# Patient Record
Sex: Female | Born: 2005 | Race: White | Hispanic: No | Marital: Single | Smoking: Never smoker
Health system: Southern US, Academic
[De-identification: ages and names within clinical notes are randomized; demographics above are authoritative.]

## PROBLEM LIST (undated history)

## (undated) DIAGNOSIS — S42309A Unspecified fracture of shaft of humerus, unspecified arm, initial encounter for closed fracture: Secondary | ICD-10-CM

## (undated) HISTORY — PX: MOUTH SURGERY: SHX715

---
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
## 2024-05-06 ENCOUNTER — Other Ambulatory Visit: Payer: Self-pay

## 2024-05-06 ENCOUNTER — Telehealth (INDEPENDENT_AMBULATORY_CARE_PROVIDER_SITE_OTHER): Payer: Self-pay | Admitting: Medical

## 2024-05-06 MED ORDER — SULFAMETHOXAZOLE 800 MG-TRIMETHOPRIM 160 MG TABLET
1.0000 | ORAL_TABLET | Freq: Two times a day (BID) | ORAL | 0 refills | Status: DC
Start: 2024-05-06 — End: 2024-05-16
  Filled 2024-05-06: qty 20, 10d supply, fill #0

## 2024-05-06 MED ORDER — CEPHALEXIN 500 MG CAPSULE
500.0000 mg | ORAL_CAPSULE | Freq: Three times a day (TID) | ORAL | 0 refills | Status: DC
Start: 2024-05-06 — End: 2024-05-16
  Filled 2024-05-06: qty 30, 10d supply, fill #0

## 2024-05-06 NOTE — Telephone Encounter (Signed)
 Mom case Production designer, theatre/television/film and RN in office check daughters buttocks for cyst like area red and painful doing warm compresses and no drainage pain with walking   Will continue warm compresses and add   Antibiotic if not better will assess in office     Orders Placed This Encounter    cephalexin (KEFLEX) 500 mg Oral Capsule    trimethoprim-sulfamethoxazole (BACTRIM DS) 160-800mg  per tablet

## 2024-05-16 ENCOUNTER — Encounter (INDEPENDENT_AMBULATORY_CARE_PROVIDER_SITE_OTHER): Payer: Self-pay | Admitting: Medical

## 2024-05-16 ENCOUNTER — Other Ambulatory Visit: Payer: Self-pay

## 2024-05-16 ENCOUNTER — Ambulatory Visit (INDEPENDENT_AMBULATORY_CARE_PROVIDER_SITE_OTHER): Admitting: Medical

## 2024-05-16 VITALS — BP 100/70 | HR 75 | Temp 97.2°F | Resp 18 | Ht 65.0 in | Wt 149.4 lb

## 2024-05-16 DIAGNOSIS — L0591 Pilonidal cyst without abscess: Secondary | ICD-10-CM

## 2024-05-16 NOTE — Progress Notes (Signed)
 [facility]  [address] [place] [address]  Phone: [phone]  Fax: [phone]        Patient name:  Karen Hood  MRN:  [id number]  DOB:  2006-03-15  DATE:  05/16/2024      Subjective:     Patient ID:  Karen Hood is an 18 y.o. female     Chief Complaint:    Chief Complaint   Patient presents with    Cyst     Buttocks       The history is provided by the patient. No language interpreter was used.   Wound Infection  This is a new (occuried Sept 28 with soreness and red area to tailbone heat adn antibiotic and doing better drainged on October 1) problem. The problem has been resolved. Pertinent negatives include no chest pain, fatigue or fever. Associated symptoms comments: Pain only now in college sitting more and horse back riding . Exacerbated by: sitting. She has tried heat (antibiotic) for the symptoms. The treatment provided significant relief.       Review of Systems   Constitutional:  Negative for activity change, fatigue and fever.   Respiratory:  Negative for shortness of breath.    Cardiovascular:  Negative for chest pain and leg swelling.   Gastrointestinal:  Negative for abdominal distention.   Skin:  Positive for color change.       Objective:     Vitals:    05/16/24 1136   BP: 100/70   Pulse: 75   Resp: 18   Temp: 36.2 C (97.2 F)   TempSrc: Temporal   SpO2: 99%   Weight: 67.8 kg (149 lb 7 oz)   Height: 1.651 m (5' 5)   BMI: 24.87     79 %ile (Z= 0.82) based on CDC (Girls, 2-20 Years) BMI-for-age based on BMI available on 05/16/2024.   Physical Exam  Vitals and nursing note reviewed.   Constitutional:       Appearance: Normal appearance.   HENT:      Head: Normocephalic.   Pulmonary:      Effort: Pulmonary effort is normal.   Abdominal:      Palpations: Abdomen is soft.   Musculoskeletal:      Cervical back: Neck supple.   Skin:     Findings: Lesion (in gluteal fold small dimple and slight induration onto right buttocks no drainage or  erythema) present.   Neurological:      General: No focal deficit present.      Mental Status: She is alert and oriented to person, place, and time.         Ortho Exam    Assessment & Plan:       ICD-10-CM    1. Pilonidal cyst  L05.91 Referral to GENERAL SURGERY - [place] - CHOI, MADDEN, RAYMOND, VASANI        Orders Placed This Encounter    Referral to GENERAL SURGERY - [place] - CHOI, MADDEN, RAYMOND, VASANI    Keep area dry   Will see about removal non urgent       No follow-ups on file.    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

## 2024-05-29 ENCOUNTER — Other Ambulatory Visit: Payer: Self-pay

## 2024-06-30 ENCOUNTER — Ambulatory Visit: Attending: Medical | Admitting: Surgery

## 2024-06-30 ENCOUNTER — Other Ambulatory Visit: Payer: Self-pay

## 2024-06-30 ENCOUNTER — Encounter (HOSPITAL_BASED_OUTPATIENT_CLINIC_OR_DEPARTMENT_OTHER): Payer: Self-pay | Admitting: Surgery

## 2024-06-30 DIAGNOSIS — L0591 Pilonidal cyst without abscess: Secondary | ICD-10-CM | POA: Insufficient documentation

## 2024-06-30 NOTE — H&P (Signed)
 [facility] General Surgery  Clinic History & Physical    Patient: Karen Hood  D.O.B.: 02/08/2006  MRN# [id number]  Date of Service: 06/30/2024  REFERRING PROVIDER: Lonzie Lamprey    HPI  Karen Hood is a 18 y.o. female who was seen today for Pilonidal Cyst. She complains of a pilonidal cyst. She first noticed cyst in September. She was prescribed antibiotics.  She reports area open and drained. She denies previous incision and drainage.     Past Medical History:   Diagnosis Date    Exercise-induced asthma 07/27/2022          Past Surgical History:   Procedure Laterality Date    MOUTH SURGERY            Current Outpatient Medications   Medication Sig    albuterol  sulfate (PROAIR  HFA) 90 mcg/actuation Inhalation oral inhaler Take 1-2 Puffs by inhalation Every 6 hours as needed (exercise)     Allergies[1]   Social History     Tobacco Use    Smoking status: Never     Passive exposure: Never    Smokeless tobacco: Never   Substance Use Topics    Alcohol use: Never     Social History     Substance and Sexual Activity   Drug Use Never      Family Medical History:       Problem Relation (Age of Onset)    Cancer General Family Hx    Coronary Artery Disease Maternal Grandfather    Dermatomyositis Maternal Grandmother    Hypertension (High Blood Pressure) Mother    Melanoma Maternal Grandfather    No Known Problems Father                ROS:  General:  []  fever  []  chills  []  any changes in weight  EENT: []  blurry vision []  hearing loss  Cardiovascular: []   chest pain []   palpitations  Respiratory: []   SOB []  cough  Gastrointestinal: []   abdominal pain []  N/V []  constipation []  diarrhea []  any change in bowel movements []  rectal bleeding  GU: []   dysuria []  change in urinary habits []  hematuria.  Musculoskeletal: []   weakness []  joint pain []   trouble moving extremities  Neurological: []  HA []  dizziness  Hematologic: []   hx of bleeding disorders []   blood clots  Psychiatric: []  changes in mood []  anxiety []   depression    All ROS negative except those marked as above.      Objective:  BP 130/61   Pulse 72   Temp 36.6 C (97.9 F)   Resp 18   Ht 1.676 m (5' 6)   Wt 68.9 kg (151 lb 14.4 oz)   SpO2 100%   BMI 24.52 kg/m         Physical Exam:  Constitutional:  Alert, healthy, well nourished, no acute distress and not in pain.  HEENT: Normal  Neck: Trachea midline, supple.  Pulmonary: Normal effort and rate observed.  Cardiovascular: Regular rate and rhythm.  Abdomen/GI: Abdomen soft and supple; No tenderness. Non-distended; No masses present. Bowel sounds normal and active in all 4 quadrants.  Extremities: No peripheral edema; No cyanosis or clubbing of nails.  Musculoskeletal: Normal muscle strength and tone of all four extremities.  Skin: No rashes or lesions present; Warm and dry. No jaundice. Pilonidal sinus tract.   Psychiatric: Normal mood and affect; Judgement and thought content normal.      Assessment:  ENCOUNTER DIAGNOSES  ICD-10-CM   1. Pilonidal cyst  L05.91        Plan:   Orders Placed This Encounter   No orders of the following type(s) were placed in this encounter: Procedures, Microbiology, Radiation Oncology, Imaging, Immunization/Injection, Lab, Outpatient Referral, Code Status, Consult, OT, PT, SLP, Card Rehab, REHAB, [employer], Cardiac Services, ENT, Point of Care Testing, Electrophysiology, C[facility]ID CARDIAC SERVICES, ECHO, Blood Bank, Health Maintenance, Neurology, Wound Ostomy, PFT, OB, Ophthalmology, Sleep Center, Audiology, Therapeutic Recreation Orderables, IV, Cardiac Cath, GU Procedure, Dialysis, Substance Abuse, GI, Pharmacy Supplies, Schedule Follow Up Orders, RIS Invasive.     Medication Orders   No Medications ordered     1.) Follow up as needed.       Return if symptoms worsen or fail to improve.    She was given the opportunity to ask questions and those questions were appropriately answered. She agreed with the treatment plan and is encouraged to call with any additional  questions or concerns.       Heather Puls, RN    I am scribing for, and in the presence of Dr. Morene Cassis for services provided on 06/30/2024.  Heather Puls, RN      I personally performed the services described in this documentation, as scribed  in my presence, and it is both accurate  and complete.    Morene Cassis, DO         [1] No Known Allergies
